# Patient Record
Sex: Female | Born: 1975 | Race: Black or African American | Hispanic: No | Marital: Single | Smoking: Current every day smoker
Health system: Southern US, Community
[De-identification: ages and names within clinical notes are randomized; demographics above are authoritative.]

## PROBLEM LIST (undated history)

## (undated) DIAGNOSIS — I10 Essential (primary) hypertension: Secondary | ICD-10-CM

## (undated) DIAGNOSIS — J45909 Unspecified asthma, uncomplicated: Secondary | ICD-10-CM

## (undated) HISTORY — PX: BACK SURGERY: SHX140

## (undated) HISTORY — PX: NASAL SINUS SURGERY: SHX719

## (undated) HISTORY — PX: HAND SURGERY: SHX662

---

## 2017-08-05 ENCOUNTER — Emergency Department: Payer: Medicaid Other

## 2017-08-05 ENCOUNTER — Emergency Department
Admission: EM | Admit: 2017-08-05 | Discharge: 2017-08-05 | Disposition: A | Discharge status: Home / Self Care | Payer: Medicaid Other | Attending: Emergency Medicine | Admitting: Emergency Medicine

## 2017-08-05 DIAGNOSIS — F17210 Nicotine dependence, cigarettes, uncomplicated: Secondary | ICD-10-CM | POA: Diagnosis not present

## 2017-08-05 DIAGNOSIS — J441 Chronic obstructive pulmonary disease with (acute) exacerbation: Secondary | ICD-10-CM | POA: Diagnosis not present

## 2017-08-05 DIAGNOSIS — J40 Bronchitis, not specified as acute or chronic: Secondary | ICD-10-CM | POA: Diagnosis not present

## 2017-08-05 DIAGNOSIS — Z79899 Other long term (current) drug therapy: Secondary | ICD-10-CM | POA: Diagnosis not present

## 2017-08-05 DIAGNOSIS — R0602 Shortness of breath: Secondary | ICD-10-CM | POA: Diagnosis present

## 2017-08-05 HISTORY — DX: Unspecified asthma, uncomplicated: J45.909

## 2017-08-05 MED ORDER — IPRATROPIUM-ALBUTEROL 0.5-2.5 (3) MG/3ML IN SOLN
RESPIRATORY_TRACT | Status: AC
  Filled 2017-08-05: qty 3

## 2017-08-05 MED ORDER — IPRATROPIUM-ALBUTEROL 0.5-2.5 (3) MG/3ML IN SOLN
RESPIRATORY_TRACT | Status: AC
  Administered 2017-08-05: 3 mL via RESPIRATORY_TRACT
  Filled 2017-08-05: qty 3

## 2017-08-05 MED ORDER — IPRATROPIUM-ALBUTEROL 0.5-2.5 (3) MG/3ML IN SOLN
3.0000 mL | Freq: Once | RESPIRATORY_TRACT | Status: AC
  Administered 2017-08-05: 3 mL via RESPIRATORY_TRACT

## 2017-08-05 MED ORDER — ALBUTEROL SULFATE HFA 108 (90 BASE) MCG/ACT IN AERS: 2.0000 | INHALATION_SPRAY | RESPIRATORY_TRACT | 0 refills | Status: AC | PRN

## 2017-08-05 MED ORDER — AZITHROMYCIN 500 MG PO TABS
ORAL_TABLET | ORAL | Status: AC
  Filled 2017-08-05: qty 1

## 2017-08-05 MED ORDER — IBUPROFEN 600 MG PO TABS
ORAL_TABLET | ORAL | Status: AC
  Filled 2017-08-05: qty 1

## 2017-08-05 MED ORDER — IPRATROPIUM-ALBUTEROL 0.5-2.5 (3) MG/3ML IN SOLN: 3.0000 mL | RESPIRATORY_TRACT | 0 refills | Status: AC | PRN

## 2017-08-05 MED ORDER — AZITHROMYCIN 250 MG PO TABS: ORAL_TABLET | ORAL | 0 refills | Status: AC

## 2017-08-05 MED ORDER — AZITHROMYCIN 500 MG PO TABS
500.0000 mg | ORAL_TABLET | Freq: Once | ORAL | Status: AC
  Administered 2017-08-05: 500 mg via ORAL
  Filled 2017-08-05: qty 1

## 2017-08-05 MED ORDER — IPRATROPIUM-ALBUTEROL 0.5-2.5 (3) MG/3ML IN SOLN
3.0000 mL | Freq: Once | RESPIRATORY_TRACT | Status: AC
  Administered 2017-08-05: 3 mL via RESPIRATORY_TRACT
  Filled 2017-08-05: qty 6

## 2017-08-05 MED ORDER — IBUPROFEN 600 MG PO TABS
ORAL_TABLET | ORAL | Status: AC
  Administered 2017-08-05: 600 mg via ORAL
  Filled 2017-08-05: qty 1

## 2017-08-05 MED ORDER — IBUPROFEN 600 MG PO TABS
600.0000 mg | ORAL_TABLET | Freq: Once | ORAL | Status: AC
  Administered 2017-08-05: 600 mg via ORAL

## 2017-08-05 MED ORDER — PREDNISONE 10 MG PO TABS: ORAL_TABLET | ORAL | 0 refills | Status: AC

## 2017-08-05 MED ORDER — PREDNISONE 20 MG PO TABS
40.0000 mg | ORAL_TABLET | Freq: Once | ORAL | Status: AC
  Administered 2017-08-05: 40 mg via ORAL
  Filled 2017-08-05: qty 2

## 2017-08-05 NOTE — ED Notes (Signed)

## 2017-08-05 NOTE — Discharge Instructions (Signed)
You are evaluated for wheezing and are being treated for COPD exacerbation and bronchitis.  Given your history of smoking, and the very thick cough, I am placing her on antibiotic azithromycin.  You are also being treated with prednisone to help with COPD inflammation.  Use your albuterol inhaler 2 puffs every 4 hours as needed for wheezing trouble breathing.  Return to the emergency room immediately for any new or worsening breathing condition, trouble breathing, chest pain, fevers, confusion altered mental status, dizziness or passing out, or any other symptoms concerning to you.

## 2017-08-05 NOTE — ED Notes (Signed)
[facility]. Pt is labored breathing and is afebrile. Pt has hx of asthma EDP at bedside.

## 2017-08-05 NOTE — ED Notes (Signed)
Ed discharge signature pad not responding, pt signed hard copy and placed on her chart.

## 2017-08-05 NOTE — ED Provider Notes (Signed)
[facility] Emergency Department Provider Note ____________________________________________   I have reviewed the triage vital signs and the triage nursing note.  HISTORY  Chief Complaint Shortness of Breath   Historian Patient  HPI Christina Rios is a 41 y.o. female with a history of asthma presents with wheezing and shortness of breath which has been there all night.  Last episode this severe was about a year ago.  She has not been on prednisone since about a year ago.  She states that she has been coughing for couple days and bringing up yellow sputum.  She is a smoker.  Denies fevers.  She tried multiple uses of albuterol inhaler overnight, and she is still wheezing quite a bit.  Symptoms are moderate to severe.  Nothing makes it worse or better   Past Medical History:  Diagnosis Date  . Asthma     There are no active problems to display for this patient.   History reviewed. No pertinent surgical history.  Prior to Admission medications   Medication Sig Start Date End Date Taking? Authorizing Provider  hydrochlorothiazide (HYDRODIURIL) 25 MG tablet Take 25 mg by mouth daily.   Yes [provider]  propranolol (INDERAL) 60 MG tablet Take 60 mg by mouth 2 (two) times daily.   Yes [provider]  albuterol (PROVENTIL HFA;VENTOLIN HFA) 108 (90 Base) MCG/ACT inhaler Inhale 1-2 puffs into the lungs every 4 (four) hours as needed for wheezing or shortness of breath.    [provider]  SUMAtriptan (IMITREX) 50 MG tablet Take 50 mg by mouth every 2 (two) hours as needed for migraine. May repeat in 2 hours if headache persists or recurs.    [provider]    No Known Allergies  No family history on file.  Social History Social History  Substance Use Topics  . Smoking status: Current Every Day Smoker    Packs/day: 0.50    Years: 5.00    Types: Cigarettes  . Smokeless tobacco: Not on file     Comment: pt states  she is trying to quit  . Alcohol use Not on file    Review of Systems  Constitutional: Negative for fever. Eyes: Negative for visual changes. ENT: Negative for sore throat. Cardiovascular: Negative for chest pain. Respiratory: Positive for shortness of breath. Gastrointestinal: Negative for abdominal pain, vomiting and diarrhea. Genitourinary: Negative for dysuria. Musculoskeletal: Negative for back pain. Skin: Negative for rash. Neurological: Negative for headache.  ____________________________________________   PHYSICAL EXAM:  VITAL SIGNS: ED Triage Vitals  Enc Vitals Group     BP 08/05/17 1536 (!) 136/92     Pulse Rate 08/05/17 1536 97     Resp 08/05/17 1536 (!) 44     Temp 08/05/17 1536 (!) 96.6 F (35.9 C)     Temp Source 08/05/17 1536 Axillary     SpO2 08/05/17 1536 100 %     Weight 08/05/17 1537 200 lb (90.7 kg)     Height 08/05/17 1537 5\' 5"  (1.651 m)     Head Circumference --      Peak Flow --      Pain Score --      Pain Loc --      Pain Edu? --      Excl. in GC? --      Constitutional: Alert and oriented. Well appearing overall, moderate respiratory distress with active wheezing HEENT   Head: Normocephalic and atraumatic.      Eyes: Conjunctivae are normal.  Pupils equal and round.       Ears:         Nose: No congestion/rhinnorhea.   Mouth/Throat: Mucous membranes are moist.   Neck: No stridor. Cardiovascular/Chest: Normal rate, regular rhythm.  No murmurs, rubs, or gallops. Respiratory: Wheezing all lung fields, moderate decreased air movement throughout.  Speaks in short sentences.  Moderate rhonchi both bases. Gastrointestinal: Soft. No distention, no guarding, no rebound. Nontender.    Genitourinary/rectal:Deferred Musculoskeletal: Nontender with normal range of motion in all extremities. No joint effusions.  No lower extremity tenderness.  No edema. Neurologic:  Normal speech and language. No gross or focal neurologic deficits are  appreciated. Skin:  Skin is warm, dry and intact. No rash noted. Psychiatric: Mood and affect are normal. Speech and behavior are normal. Patient exhibits appropriate insight and judgment.   ____________________________________________  LABS (pertinent positives/negatives) I, Governor Rooksebecca Demetrion Wesby, MD the attending physician have reviewed the labs noted below.  Labs Reviewed - No data to display  ____________________________________________    EKG I, Governor Rooksebecca Kolbee Bogusz, MD, the attending physician have personally viewed and interpreted all ECGs.  61 bpm.  Normal sinus rhythm.  Narrow QS with normal axis.  Normal ST and T wave ____________________________________________  RADIOLOGY All Xrays were viewed by me.  Imaging interpreted by Radiologist, and I, Governor Rooksebecca Avea Mcgowen, MD the attending physician have reviewed the radiologist interpretation noted below.  Chest x-ray portable:  FINDINGS: Lungs are clear. Heart is upper normal in size with pulmonary vascularity within normal limits. No adenopathy. Postoperative change noted in the lower cervical spine.  IMPRESSION: No edema or consolidation. __________________________________________  PROCEDURES  Procedure(s) performed: None  Critical Care performed: None  ____________________________________________  No current facility-administered medications on file prior to encounter.    No current outpatient prescriptions on file prior to encounter.    ____________________________________________  ED COURSE / ASSESSMENT AND PLAN  Pertinent labs & imaging results that were available during my care of the patient were reviewed by me and considered in my medical decision making (see chart for details).    Patient with a history of wheezing and COPD as well as a smoker has very thick cough, but mostly wheezing.  She is not febrile.  She is not having any systemic symptoms.  Her chest x-ray is clear for pneumonia, however given smoking history  and COPD and thick cough, I am going to cover her with azithromycin.  Patient was given prednisone here as well as multiple duo nebs with significant improvement.  No hypoxia here.  No chest pain, not concerned for cardiac conditions.  Which is to hold off on any blood work at this point given mostly just wheezing.  She does continue to have a little bit of wheezing, but I do think she is okay for outpatient management.  Patient would like to go home.  I think this is reasonable.  She walked and O2 sat stayed in the 90s, heart rate went up to 120, patient was a little dyspneic, but okay.  We discussed whether or not to observe overnight, but patient would like to go home and I do think this is reasonable.  She was given prescription both for albuterol for her nebulizer which she is out as well as albuterol inhaler.  DIFFERENTIAL DIAGNOSIS: Differential includes, but is not limited to, viral syndrome, bronchitis including COPD exacerbation, pneumonia, reactive airway disease including asthma, CHF including exacerbation with or without pulmonary/interstitial edema, pneumothorax, ACS, thoracic trauma, and pulmonary embolism.  CONSULTATIONS:  None   Patient / Family / Caregiver informed of clinical course, medical decision-making process, and agree with plan.   I discussed return precautions, follow-up instructions, and discharge instructions with patient and/or family.  Discharge Instructions : You are evaluated for wheezing and are being treated for COPD exacerbation and bronchitis.  Given your history of smoking, and the very thick cough, I am placing her on antibiotic azithromycin.  You are also being treated with prednisone to help with COPD inflammation.  Use your albuterol inhaler 2 puffs every 4 hours as needed for wheezing trouble breathing.  Return to the emergency room immediately for any new or worsening breathing condition, trouble breathing, chest pain, fevers, confusion altered  mental status, dizziness or passing out, or any other symptoms concerning to you.  ___________________________________________   FINAL CLINICAL IMPRESSION(S) / ED DIAGNOSES   Final diagnoses:  Bronchitis  COPD exacerbation (HCC)              Note: This dictation was prepared with Dragon dictation. Any transcriptional errors that result from this process are unintentional    Governor Rooks, MD 08/05/17 1954

## 2017-08-05 NOTE — ED Notes (Signed)
Pt was assisted with ambulation around nurses station, pt's oxygen began at 97% and rose to 100% during ambulation however pt's heart rate became sinus tach and pt stated she "felt short of breath" during ambulation. EDP notified and is in rm talking with pt.

## 2017-08-05 NOTE — ED Triage Notes (Signed)
FIRST NURSE NOTE-pt thinks has food poisoning. C/o NVD. Ambulatory without difficulty. Wrapped in comforter.

## 2017-08-05 NOTE — ED Triage Notes (Addendum)
FIRST NURSE NOTE-cough and congestion. Asthma pt. Feels SHOB. Some increased WOB noted. Out of nebulizer medication.

## 2017-08-05 NOTE — ED Triage Notes (Signed)
Pt reports starting to feel bad yesterday states that she used her inhaler all night last night without relief, pt is labored to breathe in triage with audible wheezing

## 2017-10-28 ENCOUNTER — Other Ambulatory Visit: Payer: Self-pay

## 2017-10-28 ENCOUNTER — Emergency Department
Admission: EM | Admit: 2017-10-28 | Discharge: 2017-10-28 | Disposition: A | Discharge status: Home / Self Care | Payer: Medicaid Other | Attending: Emergency Medicine | Admitting: Emergency Medicine

## 2017-10-28 ENCOUNTER — Encounter: Payer: Self-pay | Admitting: Emergency Medicine

## 2017-10-28 DIAGNOSIS — R531 Weakness: Secondary | ICD-10-CM | POA: Diagnosis not present

## 2017-10-28 DIAGNOSIS — G5132 Clonic hemifacial spasm, left: Secondary | ICD-10-CM | POA: Insufficient documentation

## 2017-10-28 DIAGNOSIS — R51 Headache: Secondary | ICD-10-CM | POA: Diagnosis present

## 2017-10-28 DIAGNOSIS — F17210 Nicotine dependence, cigarettes, uncomplicated: Secondary | ICD-10-CM | POA: Insufficient documentation

## 2017-10-28 DIAGNOSIS — G43109 Migraine with aura, not intractable, without status migrainosus: Secondary | ICD-10-CM | POA: Diagnosis not present

## 2017-10-28 DIAGNOSIS — J45909 Unspecified asthma, uncomplicated: Secondary | ICD-10-CM | POA: Diagnosis not present

## 2017-10-28 DIAGNOSIS — Z79899 Other long term (current) drug therapy: Secondary | ICD-10-CM | POA: Diagnosis not present

## 2017-10-28 MED ORDER — METOCLOPRAMIDE HCL 5 MG/ML IJ SOLN
10.0000 mg | Freq: Once | INTRAMUSCULAR | Status: AC
  Administered 2017-10-28: 10 mg via INTRAVENOUS
  Filled 2017-10-28: qty 2

## 2017-10-28 MED ORDER — SODIUM CHLORIDE 0.9 % IV BOLUS (SEPSIS)
1000.0000 mL | Freq: Once | INTRAVENOUS | Status: AC
  Administered 2017-10-28: 1000 mL via INTRAVENOUS

## 2017-10-28 MED ORDER — KETOROLAC TROMETHAMINE 30 MG/ML IJ SOLN
30.0000 mg | Freq: Once | INTRAMUSCULAR | Status: AC
  Administered 2017-10-28: 30 mg via INTRAVENOUS
  Filled 2017-10-28: qty 1

## 2017-10-28 MED ORDER — DIPHENHYDRAMINE HCL 50 MG/ML IJ SOLN
50.0000 mg | Freq: Once | INTRAMUSCULAR | Status: AC
  Administered 2017-10-28: 50 mg via INTRAVENOUS
  Filled 2017-10-28: qty 1

## 2017-10-28 MED ORDER — METHYLPREDNISOLONE SODIUM SUCC 125 MG IJ SOLR
125.0000 mg | Freq: Once | INTRAMUSCULAR | Status: AC
  Administered 2017-10-28: 125 mg via INTRAVENOUS
  Filled 2017-10-28: qty 2

## 2017-10-28 NOTE — ED Triage Notes (Signed)
Pt to ED via POV c/o facial spasms that start about 30 minutes PTA. Pt states that she is also having headache. Pt states that this is different than her normal migraine. Pt states that it is hurting in the back of her head. Pt appears to be uncomfortable. Was ambulatory to room.

## 2017-10-28 NOTE — Discharge Instructions (Signed)
Return to the emergency department for any significant worsening of headache, fever, weakness, numbness confusion or slurred speech.  Otherwise please follow-up with your doctor or a primary care doctor in the next 2-3 days for recheck.

## 2017-10-28 NOTE — ED Notes (Signed)
Pt got up and used bathroom. Now back in bed resting.

## 2017-10-28 NOTE — ED Notes (Signed)
E-signature pad not working, pt verbalized understanding of dicharge

## 2017-10-28 NOTE — ED Notes (Signed)
Pt resting comfortably. Respirations even and unlabored.  

## 2017-10-28 NOTE — ED Provider Notes (Signed)
[facility] Emergency Department Provider Note  Time seen: 7:26 AM  I have reviewed the triage vital signs and the nursing notes.   HISTORY  Chief Complaint Headache and Spasms    HPI Christina Rios is a 42 y.o. female with a past medical history of asthma, migraines, presents to the emergency department for migraine headache beginning this morning associated with facial spasms and left arm weakness.  According to the patient and her husband this is chronic for the patient she will often times have spasm of her face with her bad migraines as well as left arm weakness.  No fever.  No photo/ phonophobia.  No vomiting but some nausea.  Patient does not take any medications at home for her headache.  Has not taken anything over-the-counter for her headache.  States that headache started approximately 1-2 hours ago.  Largely negative review of systems.   Past Medical History:  Diagnosis Date  . Asthma     There are no active problems to display for this patient.   History reviewed. No pertinent surgical history.  Prior to Admission medications   Medication Sig Start Date End Date Taking? Authorizing Provider  albuterol (PROVENTIL HFA;VENTOLIN HFA) 108 (90 Base) MCG/ACT inhaler Inhale 2 puffs into the lungs every 4 (four) hours as needed for wheezing or shortness of breath. 08/05/17   Governor RooksLord, Rebecca, MD  azithromycin (ZITHROMAX) 250 MG tablet One tab daily for 4 more days 08/05/17   Governor RooksLord, Rebecca, MD  hydrochlorothiazide (HYDRODIURIL) 25 MG tablet Take 25 mg by mouth daily.    [provider]  ipratropium-albuterol (DUONEB) 0.5-2.5 (3) MG/3ML SOLN Take 3 mLs by nebulization every 4 (four) hours as needed. 08/05/17   Governor RooksLord, Rebecca, MD  predniSONE (DELTASONE) 10 MG tablet 4 tabs by mouth daily for 4 more days 08/05/17   Governor RooksLord, Rebecca, MD  propranolol (INDERAL) 60 MG tablet Take 60 mg by mouth 2 (two) times daily.    [provider]  SUMAtriptan  (IMITREX) 50 MG tablet Take 50 mg by mouth every 2 (two) hours as needed for migraine. May repeat in 2 hours if headache persists or recurs.    [provider]    No Known Allergies  No family history on file.  Social History Social History   Tobacco Use  . Smoking status: Current Every Day Smoker    Packs/day: 0.50    Years: 5.00    Pack years: 2.50    Types: Cigarettes  . Smokeless tobacco: Never Used  . Tobacco comment: pt states she is trying to quit  Substance Use Topics  . Alcohol use: No    Frequency: Never  . Drug use: No    Review of Systems Constitutional: Negative for fever. Eyes: Denies photophobia ENT: Negative for recent illness/congestion Cardiovascular: Negative for chest pain. Respiratory: Negative for shortness of breath. Gastrointestinal: Negative for abdominal pain, vomiting Genitourinary: Negative for urinary compaints Musculoskeletal: Negative for leg pain or swelling Skin: Negative for skin complaints  Neurological: Significant headache with facial spasms and left arm weakness All other ROS negative  ____________________________________________   PHYSICAL EXAM:  VITAL SIGNS: ED Triage Vitals  Enc Vitals Group     BP 10/28/17 0719 (!) 152/99     Pulse Rate 10/28/17 0717 92     Resp 10/28/17 0717 16     Temp 10/28/17 0717 97.8 F (36.6 C)     Temp Source 10/28/17 0717 Oral     SpO2 10/28/17 0717 98 %  Weight 10/28/17 0717 190 lb (86.2 kg)     Height 10/28/17 0717 5\' 6"  (1.676 m)     Head Circumference --      Peak Flow --      Pain Score --      Pain Loc --      Pain Edu? --      Excl. in GC? --    Constitutional: Alert and oriented. Well appearing and in no distress. Eyes: Normal exam ENT   Head: Normocephalic and atraumatic   Mouth/Throat: Mucous membranes are moist. Cardiovascular: Normal rate, regular rhythm. No murmur Respiratory: Normal respiratory effort without tachypnea nor retractions. Breath sounds  are clear Gastrointestinal: Soft and nontender. No distention.  Musculoskeletal: Nontender with normal range of motion in all extremities. Neurologic:  Normal speech and language.  Patient has a slightly diminished left grip strength compared to the right.  Sensation appears to be intact.  Intact motor to bilateral lower extremities.  Sensation intact.  Patient is able to move all for the face, cranial nerves intact but occasionally will have spasm in which she contracts the left side of her face. Skin:  Skin is warm, dry and intact.  Psychiatric: Mood and affect are normal.   ____________________________________________   INITIAL IMPRESSION / ASSESSMENT AND PLAN / ED COURSE  Pertinent labs & imaging results that were available during my care of the patient were reviewed by me and considered in my medical decision making (see chart for details).  Patient presents to the emergency department for migraine headache associated with left facial spasm and left arm weakness.  Patient states this is chronic for her and she will get spasm and weakness with her bad migraines.  States this has been ongoing for years.  Denies any fever.  No head trauma.  Symptoms started 1-2 hours ago, no over-the-counter medications her home medications have been taken.  We will treat with Toradol, Reglan, Benadryl, fluids and IV Solu-Medrol.  We will continue to closely monitor in the emergency department.  Symptoms at this time are most consistent with a complex migraine.   Patient states her headache is much improved.  She would like to be discharged home so she can go home and sleep.  We will discharge from the emergency department with PCP follow-up.  Provided my normal headache return precautions. ____________________________________________   FINAL CLINICAL IMPRESSION(S) / ED DIAGNOSES  Complex migraine    Minna Antis, MD 10/28/17 1020

## 2018-02-24 ENCOUNTER — Emergency Department
Admission: EM | Admit: 2018-02-24 | Discharge: 2018-02-24 | Disposition: A | Discharge status: Home / Self Care | Payer: BLUE CROSS/BLUE SHIELD | Attending: Emergency Medicine | Admitting: Emergency Medicine

## 2018-02-24 ENCOUNTER — Other Ambulatory Visit: Payer: Self-pay

## 2018-02-24 ENCOUNTER — Emergency Department: Payer: BLUE CROSS/BLUE SHIELD

## 2018-02-24 DIAGNOSIS — F17210 Nicotine dependence, cigarettes, uncomplicated: Secondary | ICD-10-CM | POA: Insufficient documentation

## 2018-02-24 DIAGNOSIS — R531 Weakness: Secondary | ICD-10-CM | POA: Insufficient documentation

## 2018-02-24 DIAGNOSIS — I10 Essential (primary) hypertension: Secondary | ICD-10-CM | POA: Diagnosis not present

## 2018-02-24 DIAGNOSIS — J45909 Unspecified asthma, uncomplicated: Secondary | ICD-10-CM | POA: Diagnosis not present

## 2018-02-24 DIAGNOSIS — Z79899 Other long term (current) drug therapy: Secondary | ICD-10-CM | POA: Diagnosis not present

## 2018-02-24 DIAGNOSIS — M79602 Pain in left arm: Secondary | ICD-10-CM | POA: Insufficient documentation

## 2018-02-24 DIAGNOSIS — M5410 Radiculopathy, site unspecified: Secondary | ICD-10-CM

## 2018-02-24 DIAGNOSIS — M549 Dorsalgia, unspecified: Secondary | ICD-10-CM | POA: Diagnosis not present

## 2018-02-24 DIAGNOSIS — R200 Anesthesia of skin: Secondary | ICD-10-CM | POA: Diagnosis present

## 2018-02-24 HISTORY — DX: Essential (primary) hypertension: I10

## 2018-02-24 LAB — CBC
HEMATOCRIT: 40.6 % (ref 35.0–47.0)
HEMOGLOBIN: 13.6 g/dL (ref 12.0–16.0)
MCH: 30.5 pg (ref 26.0–34.0)
MCHC: 33.4 g/dL (ref 32.0–36.0)
MCV: 91.4 fL (ref 80.0–100.0)
Platelets: 267 10*3/uL (ref 150–440)
RBC: 4.44 MIL/uL (ref 3.80–5.20)
RDW: 14.9 % — AB (ref 11.5–14.5)
WBC: 8.3 10*3/uL (ref 3.6–11.0)

## 2018-02-24 LAB — PROTIME-INR
INR: 0.97
Prothrombin Time: 12.8 seconds (ref 11.4–15.2)

## 2018-02-24 LAB — COMPREHENSIVE METABOLIC PANEL
ALT: 11 U/L — AB (ref 14–54)
AST: 20 U/L (ref 15–41)
Albumin: 4.1 g/dL (ref 3.5–5.0)
Alkaline Phosphatase: 90 U/L (ref 38–126)
Anion gap: 6 (ref 5–15)
BILIRUBIN TOTAL: 0.6 mg/dL (ref 0.3–1.2)
BUN: 7 mg/dL (ref 6–20)
CHLORIDE: 101 mmol/L (ref 101–111)
CO2: 26 mmol/L (ref 22–32)
CREATININE: 0.68 mg/dL (ref 0.44–1.00)
Calcium: 9 mg/dL (ref 8.9–10.3)
Glucose, Bld: 79 mg/dL (ref 65–99)
Potassium: 3.5 mmol/L (ref 3.5–5.1)
Sodium: 133 mmol/L — ABNORMAL LOW (ref 135–145)
TOTAL PROTEIN: 7.2 g/dL (ref 6.5–8.1)

## 2018-02-24 LAB — DIFFERENTIAL
BASOS PCT: 1 %
Basophils Absolute: 0.1 10*3/uL (ref 0–0.1)
EOS ABS: 0.3 10*3/uL (ref 0–0.7)
Eosinophils Relative: 3 %
LYMPHS ABS: 2.7 10*3/uL (ref 1.0–3.6)
Lymphocytes Relative: 33 %
MONO ABS: 0.5 10*3/uL (ref 0.2–0.9)
MONOS PCT: 6 %
NEUTROS ABS: 4.7 10*3/uL (ref 1.4–6.5)
Neutrophils Relative %: 57 %

## 2018-02-24 LAB — TROPONIN I

## 2018-02-24 LAB — APTT: aPTT: 30 seconds (ref 24–36)

## 2018-02-24 MED ORDER — HYDROCODONE-ACETAMINOPHEN 5-325 MG PO TABS: 1.0000 | ORAL_TABLET | ORAL | 0 refills | Status: AC | PRN

## 2018-02-24 MED ORDER — HYDROCODONE-ACETAMINOPHEN 5-325 MG PO TABS
2.0000 | ORAL_TABLET | Freq: Once | ORAL | Status: AC
  Administered 2018-02-24: 2 via ORAL
  Filled 2018-02-24: qty 2

## 2018-02-24 NOTE — ED Notes (Signed)
Spoke to Dr. Marisa Severin about pt presentation. Orders for regular head CT, not stat CT, no need to call code stroke d/t pt symptoms x 7 hours.

## 2018-02-24 NOTE — ED Notes (Signed)
Engineer, mining with results of CT scan and asked for a room.

## 2018-02-24 NOTE — ED Provider Notes (Signed)
[facility] Emergency Department Provider Note  Time seen: 7:39 PM  I have reviewed the triage vital signs and the nursing notes.   HISTORY  Chief Complaint Numbness    HPI Christina Rios is a 42 y.o. female with a past medical history of hypertension, asthma, presents to the emergency department for left-sided weakness.  According to the patient she states several days ago she was doing some heavier lifting than she normally does.  She states 2 days ago she developed pain in her left back and down into her left arm.  She states the pain felt like a pinched nerve, she went to orthopedic urgent care for evaluation.  She states she has had similar pains in her back in the past.  Today she states the left arm numbness worsened and now she is having numbness and weakness to the left lower extremity as well.  Patient states she started with facial spasms, twitching of her left face today.  She states she will occasionally get facial twitches/spasm in the left side when she is under a lot of pain or stress.  Patient states she had a neck surgery performed years ago for pain in her back as well as numbness in her left arm and leg.  She states her symptoms had resolved for the most part.  But occasionally she will get left arm and leg weakness/numbness per patient but not to this degree.  Denies any history of CVA.   Past Medical History:  Diagnosis Date  . Asthma   . Hypertension     There are no active problems to display for this patient.   Past Surgical History:  Procedure Laterality Date  . BACK SURGERY    . HAND SURGERY    . NASAL SINUS SURGERY      Prior to Admission medications   Medication Sig Start Date End Date Taking? Authorizing Provider  albuterol (PROVENTIL HFA;VENTOLIN HFA) 108 (90 Base) MCG/ACT inhaler Inhale 2 puffs into the lungs every 4 (four) hours as needed for wheezing or shortness of breath. 08/05/17   Governor Rooks, MD  azithromycin  (ZITHROMAX) 250 MG tablet One tab daily for 4 more days Patient not taking: Reported on 10/28/2017 08/05/17   Governor Rooks, MD  hydrochlorothiazide (HYDRODIURIL) 25 MG tablet Take 25 mg by mouth daily.    [provider]  ipratropium-albuterol (DUONEB) 0.5-2.5 (3) MG/3ML SOLN Take 3 mLs by nebulization every 4 (four) hours as needed. Patient not taking: Reported on 10/28/2017 08/05/17   Governor Rooks, MD  predniSONE (DELTASONE) 10 MG tablet 4 tabs by mouth daily for 4 more days Patient not taking: Reported on 10/28/2017 08/05/17   Governor Rooks, MD  propranolol (INDERAL) 60 MG tablet Take 60 mg by mouth 2 (two) times daily.    [provider]  SUMAtriptan (IMITREX) 50 MG tablet Take 50 mg by mouth every 2 (two) hours as needed for migraine. May repeat in 2 hours if headache persists or recurs.    [provider]  traZODone (DESYREL) 50 MG tablet Take 50 mg by mouth at bedtime.    [provider]    No Known Allergies  History reviewed. No pertinent family history.  Social History Social History   Tobacco Use  . Smoking status: Current Every Day Smoker    Packs/day: 0.50    Years: 5.00    Pack years: 2.50    Types: Cigarettes  . Smokeless tobacco: Never Used  . Tobacco comment: pt states  she is trying to quit  Substance Use Topics  . Alcohol use: No    Frequency: Never  . Drug use: No    Review of Systems Constitutional: Negative for fever. Eyes: Negative for visual complaints ENT: Negative for recent illness Cardiovascular: Negative for chest pain. Respiratory: Negative for shortness of breath. Gastrointestinal: Negative for abdominal pain Genitourinary: Negative for urinary compaints Musculoskeletal: Pain in her left back.  Occasional pain shooting down the left arm and leg. Skin: Negative for skin complaints  Neurological: Patient states left arm and leg weakness/numbness.  No headache. All other ROS  negative  ____________________________________________   PHYSICAL EXAM:  VITAL SIGNS: ED Triage Vitals [02/24/18 1630]  Enc Vitals Group     BP (!) 143/94     Pulse Rate 76     Resp 18     Temp 98.4 F (36.9 C)     Temp Source Oral     SpO2 100 %     Weight 199 lb (90.3 kg)     Height  (1.753 m)     Head Circumference      Peak Flow      Pain Score 8     Pain Loc      Pain Edu?      Excl. in GC?    Constitutional: Alert and oriented. Well appearing and in no distress. Eyes: Normal exam ENT   Head: Normocephalic and atraumatic.   Mouth/Throat: Mucous membranes are moist. Cardiovascular: Normal rate, regular rhythm.  Respiratory: Normal respiratory effort without tachypnea nor retractions. Breath sounds are clear Gastrointestinal: Soft and nontender. No distention.  Musculoskeletal: Nontender with normal range of motion in all extremities.  Neurologic:  Normal speech and language.  Patient does have facial twitching of the left face, but is able to move all facial muscles, no obvious cranial nerve deficit.  Patient does have significantly decreased strength in the left grip with mild left pronator drift.  Has decreased strength in left lower extremity with left lower extremity drift. Skin:  Skin is warm, dry and intact.  Psychiatric: Mood and affect are normal.   ____________________________________________    EKG  EKG reviewed and interpreted by myself shows normal sinus rhythm at 67 bpm with a narrow QRS, normal axis, normal intervals, no concerning ST changes.  ____________________________________________    RADIOLOGY  CT scan shows age-indeterminate periventricular white matter hypodensities bilaterally.  MRI recommended.  ____________________________________________   INITIAL IMPRESSION / ASSESSMENT AND PLAN / ED COURSE  Pertinent labs & imaging results that were available during my care of the patient were reviewed by me and considered in my  medical decision making (see chart for details).  Patient presents the emergency department for pains going down her left back, left arm and leg numbness and weakness.  Patient states for the past 2 days she has had pains in her left back which she states is consistent with nerve type pain.  She has a history of requiring a neck surgery several years ago for left arm and leg weakness per patient.  States her symptoms resolved but occasionally she will get mild weakness and numbness in the left arm and leg but states it is much more pronounced today along with left facial twitching which again she states is somewhat chronic for her.  CT scan does show age-indeterminate periventricular white matter disease recommend MRI.  Given the patient's symptoms and prior neck surgery with similar symptoms we will obtain an MRI of the brain and  cervical spine as a precaution.  Patient is agreeable to this plan of care.  Right shows no acute abnormality in the brain.  Slight cervical spinal stenosis, do not believe this to be the cause of the patient's pain numbness or weakness.  We will discharge with a short course of pain medication.  Patient agreeable to this plan of care.  Patient will follow up with her doctor this week.  ____________________________________________   FINAL CLINICAL IMPRESSION(S) / ED DIAGNOSES  Left-sided weakness Neuropathic pain.    Minna Antis, MD 02/24/18 2253

## 2018-02-24 NOTE — ED Notes (Signed)
Pt returned to room from MRI att per phone call from [place]

## 2018-02-24 NOTE — ED Notes (Signed)
Patient transported to MRI with Jill Alexanders, tech

## 2018-02-24 NOTE — ED Triage Notes (Signed)
Pt states she thought she had a pinched nerve in back. Went to [facility] and then ortho [facility]. States facial spasms that began this AM. States numbness down L arm and leg while at work- pt believes around 9 or 10am. Back pain began yesterday. Alert, oriented, ambulatory. Talking in complete sentences. Moving all limbs. L arm and L leg drift noted in triage. L arm grip less than R arm.

## 2018-02-24 NOTE — ED Notes (Signed)
Patient states she will sign a waiver to have the CT scan without the urine pregnancy test. CT tech has been informed.

## 2019-08-29 IMAGING — MR MR CERVICAL SPINE W/O CM
5 series · 34 of 48 positions shown · non-contrast
Comparison: Prior CT from earlier the same day.

CLINICAL DATA: Initial evaluation for facial spasms, decreased left
arm grip, left arm and leg drift.

EXAM:
MRI HEAD WITHOUT CONTRAST
MRI CERVICAL SPINE WITHOUT CONTRAST
TECHNIQUE: Multiplanar, multiecho pulse sequences of the brain and surrounding
structures, and cervical spine, to include the craniocervical
junction and cervicothoracic junction, were obtained without
intravenous contrast.

[Series 3: T2 · sagittal · 3.0mm · 0.70mm/px · 8 of 13 slices shown (1 of 2)]
[im 1/13]
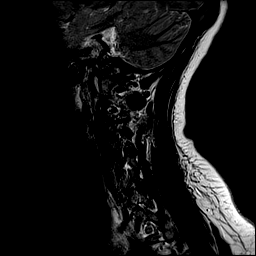
[im 2/13]
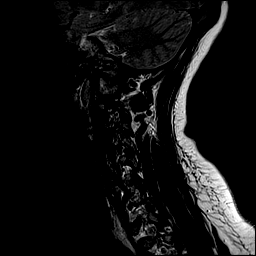
[im 4/13]
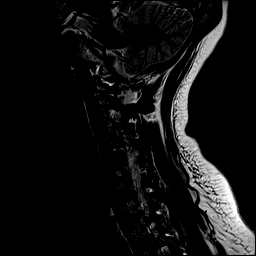
[im 6/13]
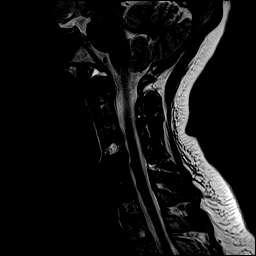
[im 7/13]
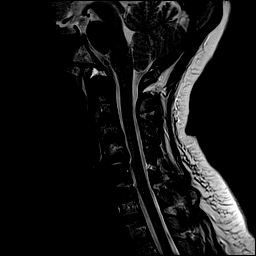
[im 9/13]
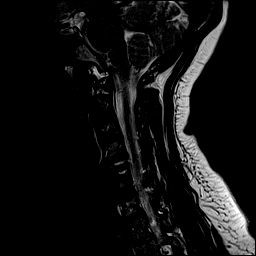
[im 11/13]
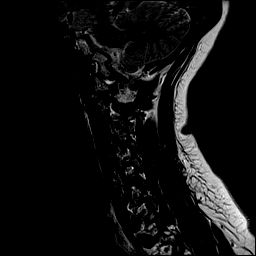
[im 13/13]
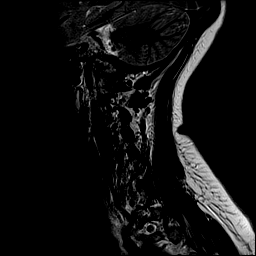

[Series 4: T1 · sagittal · 3.0mm · 0.70mm/px · 7 of 13 slices shown]
[im 1/13]
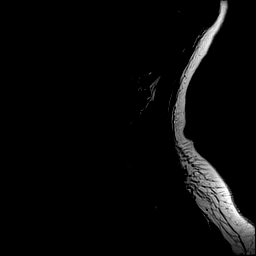
[im 3/13]
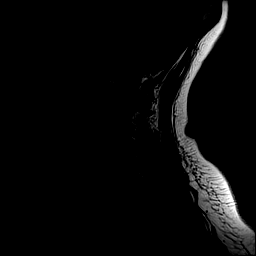
[im 5/13]
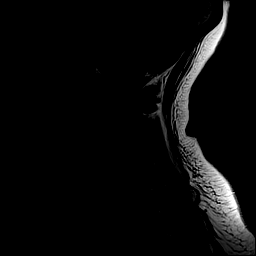
[im 7/13]
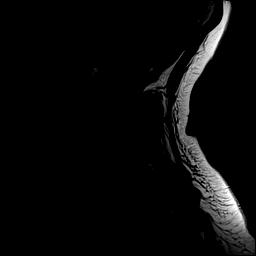
[im 9/13]
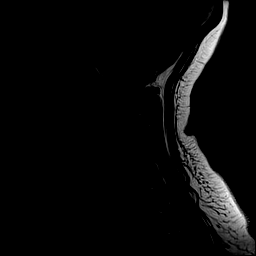
[im 11/13]
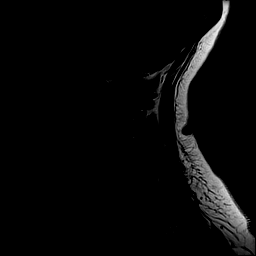
[im 13/13]
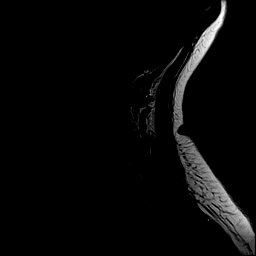

[Series 5: STIR · sagittal · 3.0mm · 0.35mm/px · 7 of 13 slices shown]
[im 1/13]
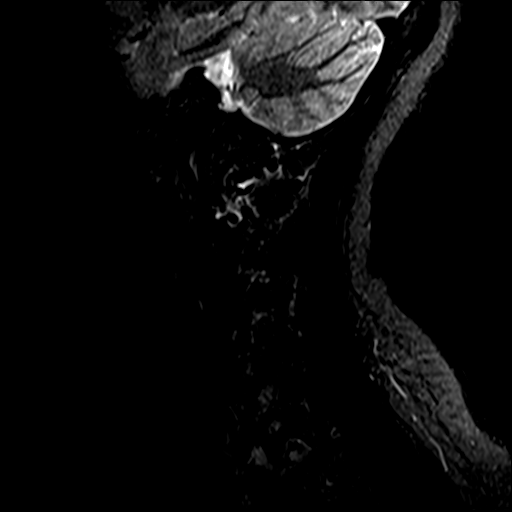
[im 3/13]
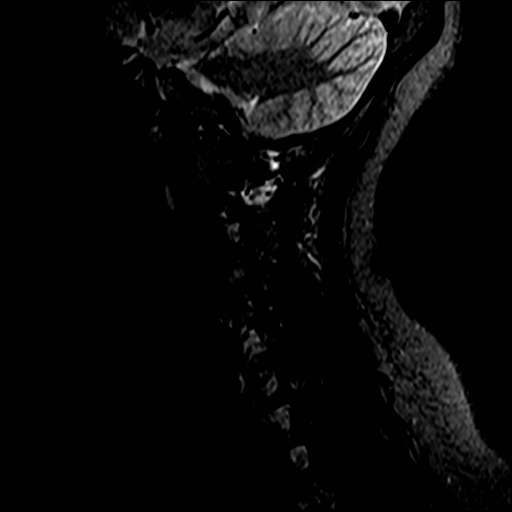
[im 5/13]
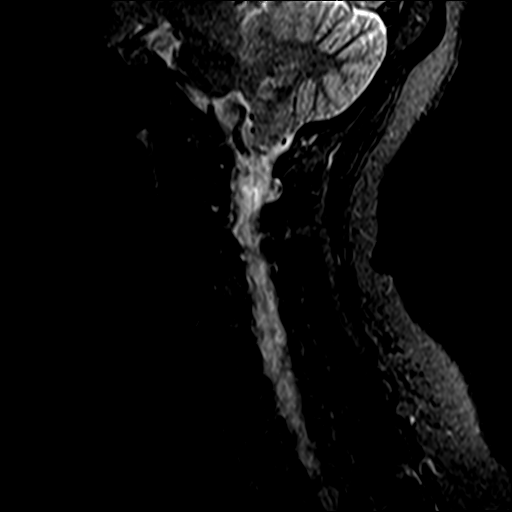
[im 7/13]
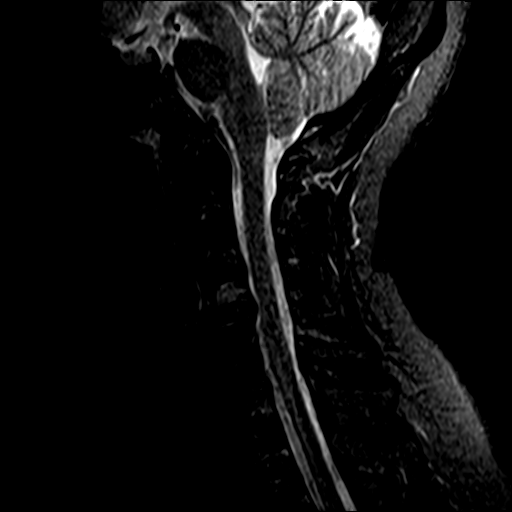
[im 9/13]
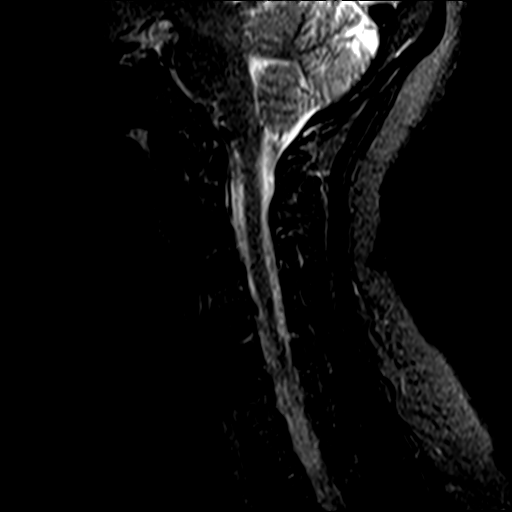
[im 11/13]
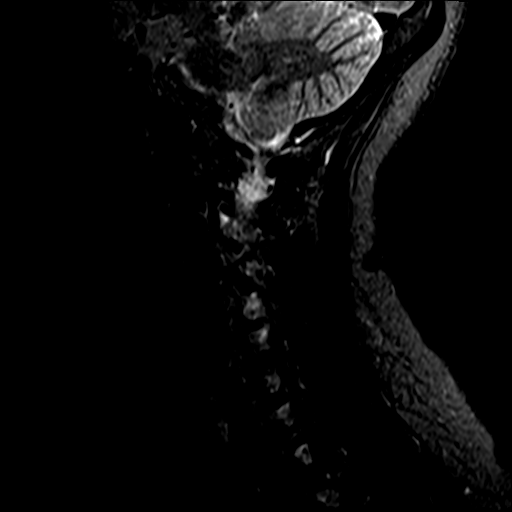
[im 13/13]
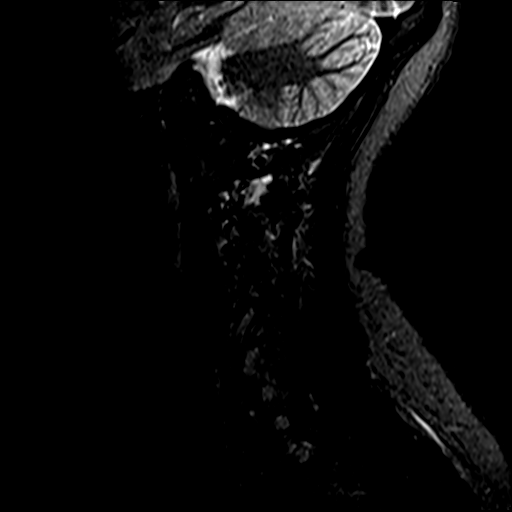

[Series 6: T2 · axial · 3.0mm · 0.70mm/px · z∈[-40,+47]mm · 9 of 24 slices shown (2 of 2)]
[im 1/24]
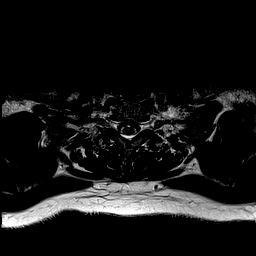
[im 4/24]
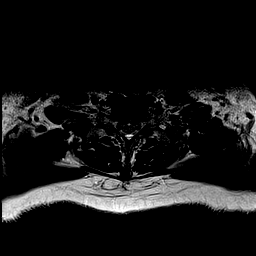
[im 8/24]
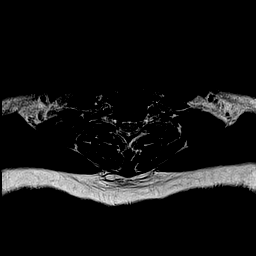
[im 10/24]
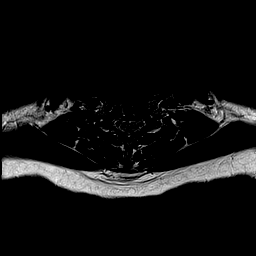
[im 12/24]
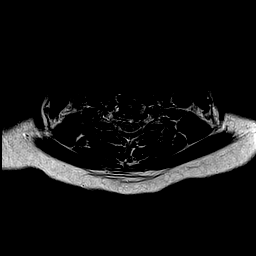
[im 14/24]
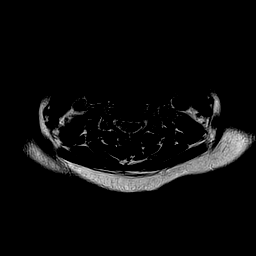
[im 16/24]
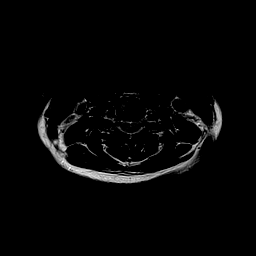
[im 20/24]
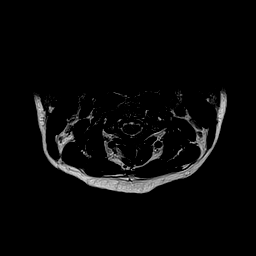
[im 24/24]
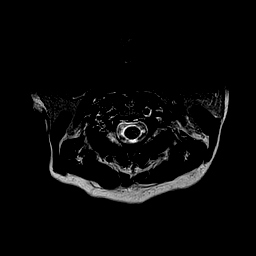

[Series 7: mpgr ax · axial · 3.0mm · 0.35mm/px · z∈[-40,-14]mm · 3 of 24 slices shown]
[im 1/24]
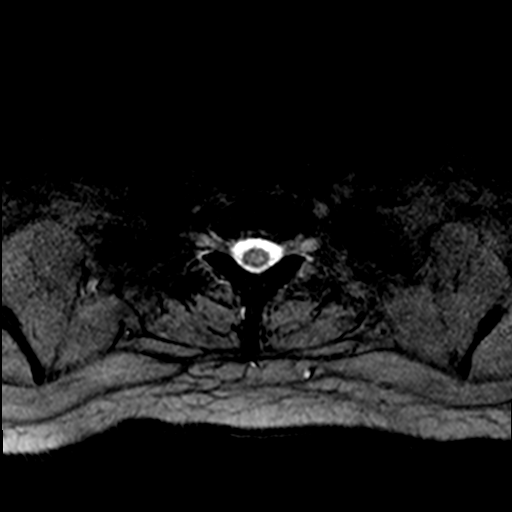
[im 4/24]
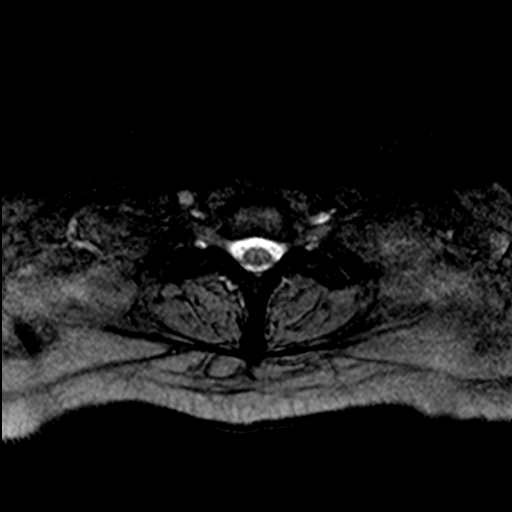
[im 8/24]
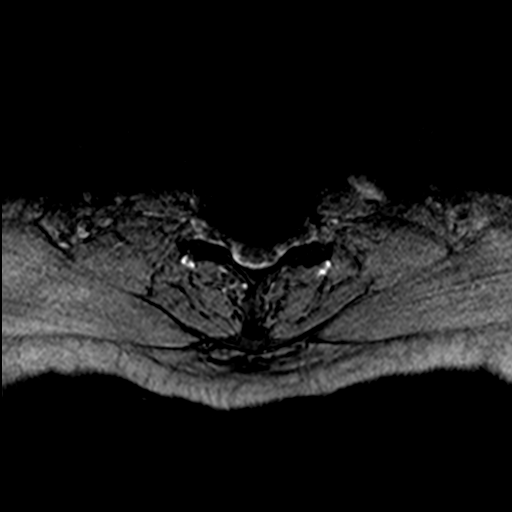

[34 of 48 positions shown; findings below may reference images not displayed]

FINDINGS: MRI HEAD FINDINGS

Brain: Cerebral volume within normal limits for age. No focal
parenchymal signal abnormality identified. No abnormal foci of
restricted diffusion to suggest acute or subacute ischemia.
Gray-white matter differentiation well maintained. No areas of
chronic infarction. No acute or chronic intracranial hemorrhage.

No mass lesion, midline shift or mass effect. No hydrocephalus. No
extra-axial fluid collection. Major dural sinuses are grossly
patent.

Pituitary gland suprasellar region within normal limits. Midline
structures intact and normal.

Vascular: Major intracranial vascular flow voids are well
maintained.

Skull and upper cervical spine: Craniocervical junction within
normal limits. Bone marrow signal intensity normal. No scalp soft
tissue abnormality.

Sinuses/Orbits: Globes and orbital soft tissues within normal
limits. Paranasal sinuses are clear. Trace opacity bilateral mastoid
air cells, of doubtful significance. Inner ear structures normal.

Other: None.

MRI CERVICAL SPINE FINDINGS

Alignment: Straightening of the normal cervical lordosis. No
listhesis.

Vertebrae: Susceptibility artifact from prior ACDF at C5-6.
Vertebral body heights maintained without evidence for acute or
chronic fracture. Bone marrow signal intensity within normal limits.
Benign hemangioma noted within C4 vertebral body. No other discrete
or worrisome osseous lesions. No abnormal marrow edema.

Cord: Signal intensity within the cervical spinal cord is normal.

Posterior Fossa, vertebral arteries, paraspinal tissues: Paraspinous
and prevertebral soft tissues within normal limits. Craniocervical
junction normal. Normal intravascular flow voids present within the
vertebral arteries bilaterally.

Disc levels:

C2-C3: Unremarkable.

C3-C4: Shallow posterior disc bulge mildly indents the ventral
thecal sac. No significant canal stenosis. Foramina remain patent.

C4-C5: Broad posterior disc bulge indents the ventral thecal sac.
Resultant mild spinal stenosis. No significant foraminal
encroachment.

C5-C6:  Prior ACDF.  No residual canal stenosis.

C6-C7:  Unremarkable.

C7-T1:  Unremarkable.

Visualized upper thoracic spine demonstrates no significant finding.
IMPRESSION: MRI HEAD IMPRESSION

Normal brain MRI.  No acute intracranial abnormality identified.

MRI CERVICAL SPINE IMPRESSION

1. Prior ACDF at C5-6 without residual stenosis.
2. Adjacent segment disease with disc bulging at C4-5 with resultant
mild spinal stenosis.
3. Small central disc protrusion at C3-4 without stenosis.
